# Patient Record
Sex: Female | Born: 1935 | Race: Black or African American | Hispanic: No | Marital: Married
Health system: Southern US, Community
[De-identification: ages and names within clinical notes are randomized; demographics above are authoritative.]

---

## 1998-07-29 ENCOUNTER — Other Ambulatory Visit: Admission: RE | Admit: 1998-07-29 | Discharge: 1998-07-29 | Payer: Self-pay | Admitting: *Deleted

## 2013-11-21 ENCOUNTER — Ambulatory Visit: Payer: Self-pay | Admitting: Family Medicine

## 2013-11-22 ENCOUNTER — Ambulatory Visit: Payer: Self-pay | Admitting: Family Medicine

## 2013-12-14 ENCOUNTER — Inpatient Hospital Stay: Payer: Self-pay | Admitting: Internal Medicine

## 2013-12-14 LAB — COMPREHENSIVE METABOLIC PANEL
Albumin: 2.7 g/dL — ABNORMAL LOW (ref 3.4–5.0)
Alkaline Phosphatase: 621 U/L — ABNORMAL HIGH
Anion Gap: 6 — ABNORMAL LOW (ref 7–16)
BILIRUBIN TOTAL: 1 mg/dL (ref 0.2–1.0)
BUN: 14 mg/dL (ref 7–18)
CALCIUM: 8.4 mg/dL — AB (ref 8.5–10.1)
CHLORIDE: 105 mmol/L (ref 98–107)
Co2: 27 mmol/L (ref 21–32)
Creatinine: 1.09 mg/dL (ref 0.60–1.30)
EGFR (African American): 53 — ABNORMAL LOW
EGFR (Non-African Amer.): 46 — ABNORMAL LOW
Glucose: 88 mg/dL (ref 65–99)
OSMOLALITY: 276 (ref 275–301)
Potassium: 3.6 mmol/L (ref 3.5–5.1)
SGOT(AST): 100 U/L — ABNORMAL HIGH (ref 15–37)
SGPT (ALT): 64 U/L — ABNORMAL HIGH
SODIUM: 138 mmol/L (ref 136–145)
TOTAL PROTEIN: 6.5 g/dL (ref 6.4–8.2)

## 2013-12-14 LAB — URINALYSIS, COMPLETE
BACTERIA: NEGATIVE
BILIRUBIN, UR: NEGATIVE
Glucose,UR: NEGATIVE mg/dL (ref 0–75)
Ketone: NEGATIVE
Leukocyte Esterase: NEGATIVE
NITRITE: NEGATIVE
Ph: 6 (ref 4.5–8.0)
Protein: NEGATIVE
RBC,UR: NONE SEEN /HPF (ref 0–5)
Specific Gravity: 1.005 (ref 1.003–1.030)
WBC UR: NONE SEEN /HPF (ref 0–5)

## 2013-12-14 LAB — CBC WITH DIFFERENTIAL/PLATELET
BASOS ABS: 0.1 10*3/uL (ref 0.0–0.1)
Basophil %: 0.7 %
EOS ABS: 0.1 10*3/uL (ref 0.0–0.7)
Eosinophil %: 1.2 %
HCT: 32.3 % — ABNORMAL LOW (ref 35.0–47.0)
HGB: 10.2 g/dL — ABNORMAL LOW (ref 12.0–16.0)
LYMPHS PCT: 16.5 %
Lymphocyte #: 1.3 10*3/uL (ref 1.0–3.6)
MCH: 31.6 pg (ref 26.0–34.0)
MCHC: 31.7 g/dL — AB (ref 32.0–36.0)
MCV: 100 fL (ref 80–100)
MONO ABS: 0.6 x10 3/mm (ref 0.2–0.9)
Monocyte %: 8.2 %
NEUTROS ABS: 5.6 10*3/uL (ref 1.4–6.5)
Neutrophil %: 73.4 %
PLATELETS: 137 10*3/uL — AB (ref 150–440)
RBC: 3.24 10*6/uL — ABNORMAL LOW (ref 3.80–5.20)
RDW: 14.8 % — AB (ref 11.5–14.5)
WBC: 7.6 10*3/uL (ref 3.6–11.0)

## 2013-12-14 LAB — TROPONIN I: Troponin-I: <0.02 ng/mL

## 2013-12-14 LAB — HEMOGLOBIN: HGB: 11.3 g/dL — ABNORMAL LOW (ref 12.0–16.0)

## 2013-12-15 LAB — HEMOGLOBIN: HGB: 10 g/dL — AB (ref 12.0–16.0)

## 2013-12-15 LAB — CBC WITH DIFFERENTIAL/PLATELET
Basophil #: 0.1 10*3/uL (ref 0.0–0.1)
Basophil %: 1 %
Eosinophil #: 0.2 10*3/uL (ref 0.0–0.7)
Eosinophil %: 2.5 %
HCT: 30.9 % — ABNORMAL LOW (ref 35.0–47.0)
HGB: 9.9 g/dL — ABNORMAL LOW (ref 12.0–16.0)
Lymphocyte #: 1.2 10*3/uL (ref 1.0–3.6)
Lymphocyte %: 16.1 %
MCH: 31.8 pg (ref 26.0–34.0)
MCHC: 32.1 g/dL (ref 32.0–36.0)
MCV: 99 fL (ref 80–100)
Monocyte #: 0.7 x10 3/mm (ref 0.2–0.9)
Monocyte %: 8.9 %
NEUTROS ABS: 5.2 10*3/uL (ref 1.4–6.5)
Neutrophil %: 71.5 %
PLATELETS: 121 10*3/uL — AB (ref 150–440)
RBC: 3.12 10*6/uL — AB (ref 3.80–5.20)
RDW: 14.5 % (ref 11.5–14.5)
WBC: 7.3 10*3/uL (ref 3.6–11.0)

## 2013-12-15 LAB — BASIC METABOLIC PANEL
Anion Gap: 10 (ref 7–16)
BUN: 12 mg/dL (ref 7–18)
CHLORIDE: 108 mmol/L — AB (ref 98–107)
Calcium, Total: 8.1 mg/dL — ABNORMAL LOW (ref 8.5–10.1)
Co2: 24 mmol/L (ref 21–32)
Creatinine: 0.83 mg/dL (ref 0.60–1.30)
EGFR (African American): >60
EGFR (Non-African Amer.): >60
GLUCOSE: 85 mg/dL (ref 65–99)
OSMOLALITY: 282 (ref 275–301)
Potassium: 3.7 mmol/L (ref 3.5–5.1)
Sodium: 142 mmol/L (ref 136–145)

## 2013-12-19 LAB — CANCER ANTIGEN 19-9

## 2013-12-19 LAB — CEA: CEA: 119.8 ng/mL — AB (ref 0.0–4.7)

## 2013-12-19 LAB — CA 125: CA 125: 479.5 U/mL — AB (ref 0.0–34.0)

## 2013-12-21 ENCOUNTER — Ambulatory Visit: Payer: Self-pay | Admitting: Gastroenterology

## 2013-12-25 LAB — PATHOLOGY REPORT

## 2013-12-28 ENCOUNTER — Ambulatory Visit: Payer: Self-pay | Admitting: Internal Medicine

## 2013-12-28 LAB — CBC CANCER CENTER
BASOS PCT: 0.4 %
Basophil #: 0 x10 3/mm (ref 0.0–0.1)
Eosinophil #: 0 x10 3/mm (ref 0.0–0.7)
Eosinophil %: 0.4 %
HCT: 25.4 % — AB (ref 35.0–47.0)
HGB: 8.2 g/dL — AB (ref 12.0–16.0)
LYMPHS ABS: 1.5 x10 3/mm (ref 1.0–3.6)
Lymphocyte %: 15.3 %
MCH: 32.1 pg (ref 26.0–34.0)
MCHC: 32.4 g/dL (ref 32.0–36.0)
MCV: 99 fL (ref 80–100)
MONOS PCT: 5.9 %
Monocyte #: 0.6 x10 3/mm (ref 0.2–0.9)
NEUTROS ABS: 7.7 x10 3/mm — AB (ref 1.4–6.5)
Neutrophil %: 78 %
PLATELETS: 145 x10 3/mm — AB (ref 150–440)
RBC: 2.57 10*6/uL — ABNORMAL LOW (ref 3.80–5.20)
RDW: 14.4 % (ref 11.5–14.5)
WBC: 9.8 x10 3/mm (ref 3.6–11.0)

## 2013-12-28 LAB — PROTIME-INR
INR: 1.3
Prothrombin Time: 15.7 secs — ABNORMAL HIGH (ref 11.5–14.7)

## 2013-12-28 LAB — HEPATIC FUNCTION PANEL A (ARMC)
ALK PHOS: 606 U/L — AB
Albumin: 3 g/dL — ABNORMAL LOW (ref 3.4–5.0)
Bilirubin, Direct: 0.7 mg/dL — ABNORMAL HIGH (ref 0.00–0.20)
Bilirubin,Total: 1.1 mg/dL — ABNORMAL HIGH (ref 0.2–1.0)
SGOT(AST): 78 U/L — ABNORMAL HIGH (ref 15–37)
SGPT (ALT): 60 U/L
Total Protein: 6.2 g/dL — ABNORMAL LOW (ref 6.4–8.2)

## 2013-12-28 LAB — APTT: Activated PTT: 29.3 secs (ref 23.6–35.9)

## 2013-12-28 LAB — CREATININE, SERUM
CREATININE: 1.18 mg/dL (ref 0.60–1.30)
EGFR (African American): 56 — ABNORMAL LOW
EGFR (Non-African Amer.): 46 — ABNORMAL LOW

## 2013-12-29 ENCOUNTER — Emergency Department: Payer: Self-pay | Admitting: Emergency Medicine

## 2013-12-29 LAB — CBC WITH DIFFERENTIAL/PLATELET
Basophil #: 0.1 10*3/uL (ref 0.0–0.1)
Basophil %: 0.7 %
EOS PCT: 0.7 %
Eosinophil #: 0.1 10*3/uL (ref 0.0–0.7)
HCT: 24 % — ABNORMAL LOW (ref 35.0–47.0)
HGB: 7.6 g/dL — AB (ref 12.0–16.0)
LYMPHS ABS: 0.9 10*3/uL — AB (ref 1.0–3.6)
Lymphocyte %: 10.9 %
MCH: 31.3 pg (ref 26.0–34.0)
MCHC: 31.7 g/dL — ABNORMAL LOW (ref 32.0–36.0)
MCV: 99 fL (ref 80–100)
MONOS PCT: 6.4 %
Monocyte #: 0.5 x10 3/mm (ref 0.2–0.9)
Neutrophil #: 7 10*3/uL — ABNORMAL HIGH (ref 1.4–6.5)
Neutrophil %: 81.3 %
Platelet: 146 10*3/uL — ABNORMAL LOW (ref 150–440)
RBC: 2.43 10*6/uL — ABNORMAL LOW (ref 3.80–5.20)
RDW: 14.3 % (ref 11.5–14.5)
WBC: 8.6 10*3/uL (ref 3.6–11.0)

## 2013-12-29 LAB — URINALYSIS, COMPLETE
Bacteria: NONE SEEN
RBC,UR: 10074 /HPF (ref 0–5)
Specific Gravity: 1.018 (ref 1.003–1.030)
WBC UR: 6 /HPF (ref 0–5)

## 2013-12-29 LAB — COMPREHENSIVE METABOLIC PANEL
ALT: 54 U/L
ANION GAP: 8 (ref 7–16)
Albumin: 2.6 g/dL — ABNORMAL LOW (ref 3.4–5.0)
Alkaline Phosphatase: 569 U/L — ABNORMAL HIGH
BILIRUBIN TOTAL: 1 mg/dL (ref 0.2–1.0)
BUN: 19 mg/dL — ABNORMAL HIGH (ref 7–18)
CHLORIDE: 102 mmol/L (ref 98–107)
CO2: 30 mmol/L (ref 21–32)
Calcium, Total: 8.7 mg/dL (ref 8.5–10.1)
Creatinine: 0.99 mg/dL (ref 0.60–1.30)
GFR CALC NON AF AMER: 57 — AB
GLUCOSE: 81 mg/dL (ref 65–99)
Osmolality: 281 (ref 275–301)
Potassium: 3.3 mmol/L — ABNORMAL LOW (ref 3.5–5.1)
SGOT(AST): 77 U/L — ABNORMAL HIGH (ref 15–37)
Sodium: 140 mmol/L (ref 136–145)
Total Protein: 6.1 g/dL — ABNORMAL LOW (ref 6.4–8.2)

## 2013-12-29 LAB — TROPONIN I: TROPONIN-I: 0.03 ng/mL

## 2013-12-31 ENCOUNTER — Emergency Department: Payer: Self-pay | Admitting: Emergency Medicine

## 2013-12-31 LAB — URINE CULTURE

## 2014-01-01 ENCOUNTER — Ambulatory Visit: Payer: Self-pay | Admitting: Internal Medicine

## 2014-01-03 ENCOUNTER — Ambulatory Visit: Payer: Self-pay | Admitting: Internal Medicine

## 2014-01-04 LAB — PATHOLOGY REPORT

## 2014-01-07 LAB — URINALYSIS, COMPLETE
Bacteria: NONE SEEN
Specific Gravity: 1.018 (ref 1.003–1.030)
Squamous Epithelial: NONE SEEN

## 2014-01-09 LAB — URINE CULTURE

## 2014-02-03 ENCOUNTER — Ambulatory Visit: Payer: Self-pay | Admitting: Internal Medicine

## 2014-02-03 DEATH — deceased

## 2014-07-27 NOTE — Consult Note (Signed)
Pt seen and examined. Please see K Earle's notes. Melena with heme positive stool and anemia. EGD was normal. In light of liver mets, patient does need to have colonoscopy scheduled. Will order tumor markers. Endoscopy closed tomorrow due to " fixing air pressures". I am not here on Sunday.  If patient requires further w/u while in hospital, then I will order bowel prep on Sunday for colonoscopy Monday AM. Otherwise, patient can be discharged and colonoscopy can be arranged as outpatient soon. Thanks.  Electronic Signatures: Lutricia Feilh, Elbert Spickler (MD)  (Signed on 11-Sep-15 15:10)  Authored  Last Updated: 11-Sep-15 15:10 by Lutricia Feilh, Donalda Job (MD)

## 2014-07-27 NOTE — H&P (Signed)
PATIENT NAME:  Katelyn Small, BALEY MR#:  [id number] DATE OF BIRTH:  03-10-1926  DATE OF ADMISSION:  12/14/2013  ADMITTING PHYSICIAN: Gladstone Lighter, MD  PRIMARY CARE PHYSICIAN: Nonlocal. It is actually Dr. Shawn Stall at [facility].   CHIEF COMPLAINT: Dark stools.   HISTORY OF PRESENT ILLNESS: Katelyn Small is an 81 year old African American female with past medical history significant for only hypertension, dementia and peripheral neuropathy, has been following up more frequently in the last few weeks secondary to newly diagnosed liver masses. According to daughter, who gives most of the history, the patient had routine blood work done at the medical center and recently was noted to have elevated liver tests. She had a follow-up ultrasound done in August, which showed multiple liver lesions following which she had a CT of the abdomen done which confirmed the lesions and they suspect widespread metastatic disease. She has a followup appointment with a GI physician for considering doing a liver biopsy because they say it is probably of unknown primary. She has not had further whole-body CT scan or a PET scan done. However, she went to the medical center yesterday and noted that her hemoglobin dropped from 12.5 in August 2015 to 10 yesterday, which was December 13, 2013. All her lab work and CT and ultrasound reports are brought by daughter here. Her hemoglobin in June was 13.4. Her hemoglobin in August was 12.5 and yesterday was 10. She had a guaiac test done for the stool yesterday, which came back positive and she was advised to come to the hospital for further work-up. The patient is not hypotensive or tachycardic at this time. She says her main issue is constipation over the last 3 to 4 days mostly and even before that for a week maybe. She never had this issue before, but her stools have been dark too for the last couple of days, so she is being admitted for GI bleed and drop in hemoglobin.    PAST MEDICAL HISTORY:  1.  Hypertension.  2.  Peripheral neuropathy.  3.  Dementia.   PAST SURGICAL HISTORY: None.   ALLERGIES: No known drug allergies.  CURRENT HOME MEDICATIONS: 1.  Aspirin 91 mg p.o. daily.  2.  Atenolol 25 mg p.o. b.i.d.  3.  Gabapentin 100 mg p.o. b.i.d.  4.  Imdur 30 mg p.o. daily.  5.  Norvasc/benazepril 10/40 mg 1 tablet p.o. daily.  6.  Potassium chloride 20 mEq p.o. daily.  7.  Donepezil 10 mg p.o. daily.   SOCIAL HISTORY: Lives at home with her husband, uses a cane to ambulate at times. Otherwise, she is steady on her feet, active at baseline. No smoking or alcohol use.   FAMILY HISTORY: Sister with ovarian cancer. Two of her daughters passed away with breast cancer.   REVIEW OF SYSTEMS: CONSTITUTIONAL: No fever, fatigue, or weakness.  EYES: No blurred vision, double vision, inflammation or glaucoma. Had lens surgery done on her right eye.  ENT: No tinnitus, ear pain, hearing loss, epistaxis or discharge.  RESPIRATORY: No cough, wheeze, hemoptysis or COPD. CARDIOVASCULAR: No chest pain, orthopnea, edema, arrhythmia, palpitations, or syncope.  GASTROINTESTINAL: No nausea or vomiting. No diarrhea. No abdominal pain. Positive for melena and constipation.  GENITOURINARY: No dysuria, hematuria, renal calculus, frequency, or incontinence.  ENDOCRINE: No polyuria, nocturia, thyroid problems, heat or cold intolerance.  HEMATOLOGY: Positive for anemia. No easy bruising or bleeding.  SKIN: No acne, rash or lesions. MUSCULOSKELETAL: Positive for bilateral foot pain from neuropathy, but no  arthritis or gout.  NEUROLOGIC: No numbness, weakness, CVA, TIA or seizures.  PSYCHOLOGICAL: No anxiety, insomnia or depression.   PHYSICAL EXAMINATION: VITAL SIGNS: Temperature 98.8 degrees Fahrenheit, pulse 58, respirations 18, blood pressure 156/75, pulse ox 96% on room air.  GENERAL: Well-built, well-nourished female lying in bed, not in any acute distress.  HEENT:  Normocephalic, atraumatic. Pupils equal, round and reacting to light. In the right eye, there seems like an extraocular lens placed. Anicteric sclerae. Extraocular movements intact. Oropharynx clear without erythema, mass or exudates.  NECK: Supple. No thyromegaly, JVD or carotid bruits. No masses. Normal range of motion without pain.  LUNGS: Moving air bilaterally. Decreased bibasilar breath sounds. No wheezing. No crackles. No use of accessory muscles for breathing.  HEART: S1 and S2, regular rate and rhythm. No murmurs, rubs, or gallops.  ABDOMEN: Soft, nontender, nondistended. No hepatosplenomegaly. Normal bowel sounds. BREASTS: No palpable breast masses.  EXTREMITIES: No pedal edema. No clubbing or cyanosis. 2+ dorsalis pedis pulses palpable bilaterally.  SKIN: No acne, rash or lesions.  LYMPH: No cervical or axillary lymphadenopathy.  NEUROLOGIC: Cranial nerves intact. No focal motor or sensory deficits.  PSYCHOLOGICAL: The patient is awake, alert and oriented x3. Gets confused to situation at times.   DIAGNOSTIC DATA: WBC 7.6, hemoglobin 10.2, hematocrit 32.3, platelet count 137,000.   Sodium 138, potassium 3.6, chloride 105, bicarb 27, BUN 14, creatinine 1.09, glucose 88, calcium 8.4.  ALT 64, AST 100, alk phos 621, total bili 1, albumin 2.7. Troponin less than 0.02.  EKG is showing sinus bradycardia, heart rate of 45 with some PACs, but no acute ST-T wave abnormalities.   ASSESSMENT AND PLAN: An 81 year old female with history of hypertension, dementia, neuropathy, and recently diagnosed liver masses in August 2015, brought for constipation and melena and anemia.  1.  Gastrointestinal bleed with melena and guaiac-positive stools. Gastroenterology has been consulted. IV Protonix has been started. Hold aspirin. Likely EGD today.  2.  Acute on chronic anemia. Hemoglobin in June was 13, in August it was 12, and now at 10. Likely source is gastrointestinal. EGD will be done and she will  also need a colonoscopy because of her constipation. Check hemoglobin q. 8 hours.  3.  Elevated liver function tests. As outpatient had CT done in August which showed multiple liver lesions, the largest of which is in the right lobe. Could be widespread metastatic disease. She has follow-up as outpatient for biopsy of the liver lesion, unknown primary, but she will get her colonoscopy as inpatient with her constipation issues.  4.  Hypertension. Continue her home medications. Continue to monitor her blood pressure closely. She is on Norvasc, benazepril, Imdur and atenolol. It seems like she is bradying down a little, so will hold the atenolol. 5.  Dementia. Seems to be at baseline. Continue her Aricept.  6.  Deep vein thrombosis prophylaxis. On TEDs and SCDs.   CODE STATUS: FULL.  TIME SPENT ON ADMISSION: 50 minutes.  ____________________________ Gladstone Lighter, MD rk:sb D: 12/14/2013 13:51:58 ET T: 12/14/2013 14:18:26 ET JOB#: [id number]  cc: Gladstone Lighter, MD, <Dictator> [facility] - Dr. Primus Bravo MD ELECTRONICALLY SIGNED 12/16/2013 11:13

## 2014-07-27 NOTE — Consult Note (Signed)
PATIENT NAME:  Katelyn Small, TORPEY MR#:  [id number] DATE OF BIRTH:  1925/05/05  DATE OF CONSULTATION:  12/14/2013  REFERRING PHYSICIAN:  Enid Baas, MD CONSULTING PHYSICIAN:  Hardie Shackleton. Grabiela Wohlford, PA-C  GASTROENTEROLOGY CONSULTATION   CONSULTING ATTENDING: Ezzard Standing. Bluford Kaufmann, MD  REASON FOR CONSULTATION: Melena.   HISTORY OF PRESENT ILLNESS: This is a pleasant 81 year old female accompanied by a family member still in the Emergency Department, who is going to be admitted for new onset melena. Per the family member, she started noticing black-colored stool since this past Tuesday, about 4 days ago. Prior to this, the patient's daughter also reports that she has been more constipated recently. The patient is denying any abdominal pain. The only pain that she is reporting currently is some lower extremity pain, for which he was actually just started on gabapentin as an outpatient. There was no nausea or vomiting. The stool is described as black, tarry and sticky, although she has not had a bowel movement for the past 2 days, and she explains that she "can't go". Recently as an outpatient she was found to have some elevated LFTs and underwent an abdominal ultrasound that was suspicious for some liver lesions. A follow-up CT scan was obtained on 11/22/2013 showing liver lesions concerning for metastatic disease, the largest measuring about 3.6 cm in the right anterior lobe of the liver without any clear primary identified. There have been no unintentional weight changes. No fever or chills. No chest pain or shortness of breath. The patient does have some early dementia and cannot tell me if she has ever had an EGD or colonoscopy in the past. She has not eaten today. She denies any NSAIDs at home outside of a baby aspirin. No alcohol use or tobacco use.   PAST MEDICAL HISTORY: Hypertension, early dementia, newly diagnosed liver lesions concerning for metastatic disease with an unidentified primary source at this point.  Neuropathy.   ALLERGIES: No known drug allergies.   HOME MEDICATIONS: Amlodipine, benazepril, baby aspirin, atenolol, gabapentin, Aricept, Imdur, NovoLog and potassium chloride.   FAMILY HISTORY: There is no known family history of GI malignancy, colon polyps, or IBD.   SOCIAL HISTORY: The patient denies any alcohol, tobacco or illicit drug use.   REVIEW OF SYSTEMS: A 10-system review of systems was obtained on the patient with the assistance of her family member. All pertinent positives are mentioned above and are otherwise negative.   OBJECTIVE:  VITAL SIGNS: Blood pressure 159/78, heart rate 55, bedside pulse oximetry 99% on room air.  GENERAL: This is a pleasant 81 year old female resting quietly and comfortably in bed, in no acute distress. Alert and oriented x 3.  HEAD: Atraumatic, normocephalic.  NECK: Supple. No lymphadenopathy noted.  HEENT: Sclerae anicteric. Mucous membranes are moist.  LUNGS: Respirations are even and unlabored. Clear to auscultation bilateral anterior lung fields.  CARDIAC: Regular rate and rhythm. S1, S2 noted.  ABDOMEN: Soft, nontender, nondistended. Normoactive bowel sounds noted in all 4 quadrants. No guarding or rebound. No masses, hernias or organomegaly appreciated.  PSYCHIATRIC: Appropriate mood and affect. RECTAL: Deferred.  EXTREMITIES: Negative for lower extremity edema; 2+ pulses noted in bilateral upper extremities.   LABORATORY DATA: White blood cells 7.6, hemoglobin 10.2, hematocrit 32.3, MCV is 100, platelets 137. Sodium 138, potassium 3.6, BUN 14, creatinine 1.09, glucose 88, bilirubin 1.0, alkaline phosphatase 621, ALT 64, AST 100, albumin 2.7.   IMAGING: An ultrasound of the abdomen was obtained on 11/21/2013, showing rounded liver lesions measuring in largest dimension 2.6  cm, another 1.2 cm, and a third 3.3 cm, concerning for metastatic disease.   A follow-up CT scan was obtained on 11/22/2013 of the abdomen and pelvis with contrast.  Findings were consistent with widespread metastatic disease in the liver. There was also metastatic disease present in the omentum bilaterally. No primary malignancy was identified. There was no evidence of bowel thickening or mass lesion.   ASSESSMENT:  1.  Melena.  2.  Elevated liver function tests.  3.  Multiple liver lesions, concerning for metastatic disease, with no primary malignancy identified at this point.    PLAN: I have discussed this patient's case in detail with Dr. Lutricia FeilPaul Oh, who was involved in the development of the patient's plan of care. Certainly, because of her reported history of melena, we agree with checking hemoglobin serially, as well as continuing PPI drip for now. We would like to keep her n.p.o. status and proceed with an upper EGD this afternoon with Dr. Bluford Kaufmannh. The risks and benefits of this procedure were outlined in detail for the patient, including risk of bleeding, perforation, infection and anesthesia, and she verbalized understanding and is in agreement to proceed. Because of the liver lesions seen on imaging, without any clear primary, we would like to check a CEA level as well. We will continue to monitor this patient throughout her hospitalization and make further recommendations pending the above endoscopic intervention and per clinical course.   All questions were answered.   Thank you so much for this consultation.   These services provided by Hardie ShackletonKaryn M. Izell Labat, PA-C under collaborative agreement with Ezzard StandingPaul Y. Bluford Kaufmannh, MD.     ____________________________ Hardie ShackletonKaryn M. Assad Harbeson, PA-C kme:MT D: 12/14/2013 13:57:00 ET T: 12/14/2013 14:37:17 ET JOB#: [id number]  cc: Hardie ShackletonKaryn M. Jeslin Bazinet, PA-C, <Dictator> Hardie ShackletonKARYN M Macon Lesesne PA ELECTRONICALLY SIGNED 12/14/2013 14:56

## 2014-07-27 NOTE — Discharge Summary (Signed)
PATIENT NAME:  Katelyn Small, Katelyn Small MR#:  [id number] DATE OF BIRTH:  January 16, 1926  DATE OF ADMISSION:  12/14/2013 DATE OF DISCHARGE:  12/15/2013  ADMITTING PHYSICIAN: Enid Baasadhika Jameil Whitmoyer, MD  DISCHARGING PHYSICIAN: Enid Baasadhika Tevyn Codd, MD  [facility]   CONSULTATIONS IN THE HOSPITAL: GI consultation by Dr. Lutricia FeilPaul Oh.   DISCHARGE DIAGNOSES:  1. Melena, with normal esophagogastroduodenoscopy. Colonoscopy scheduled as outpatient.  2. Anemia, acute on chronic, may be gastrointestinal source.  3. Liver masses, metastatic disease of unknown primary.  4. Transaminitis.  5. Hypertension.  6. Sinus bradycardia.  7. Dementia.  8. Constipation.  DISCHARGE HOME MEDICATIONS:  1. Lotrel 10/40 mg 1 tablet p.o. daily.  2. Lansoprazole 30 mg p.o. daily.  3. Gabapentin 100 mg p.o. b.i.d.  4. Aricept 10 mg p.o. at bedtime.  5. Colace 100 mg p.o. b.i.d.  6. Milk of Magnesia 30 mL daily p.r.n. for constipation.   DISCHARGE DIET: Low-sodium diet.   DISCHARGE ACTIVITY: As tolerated.    FOLLOWUP INSTRUCTIONS:  1. Advised to call Dr. Nigel Bridgemanh's office on Monday, which is 12/17/2013, after noon to schedule for a colonoscopy.  2. PCP followup in 1 week.  3. Outpatient followup with GI as scheduled for liver mass biopsy.   LABORATORIES AND IMAGING STUDIES PRIOR TO DISCHARGE:  WBC 7.3, hemoglobin 9.9, hematocrit 30.9, platelet count 121.  Sodium 142, potassium 3.7, chloride 108, bicarbonate 24, BUN 12, creatinine 0.83, glucose 85, and calcium of 8.1.  Urinalysis negative for any infection.  ALT 64, AST 100, alkaline phosphatase 621, total bilirubin 1.0, albumin of 2.7.  Troponins negative.   BRIEF HOSPITAL COURSE: Katelyn Small is an 81 year old African-American female with past medical history significant for hypertension and dementia, who was recently diagnosed to have elevated LFTs on routine blood work, which led to getting an ultrasound and CT abdomen, which confirmed multiple  masses in the liver indicative of metastatic disease of unknown primary. She saw GI as an outpatient and was in the process of getting a liver biopsy done; however, presented to the hospital with melena and bloody stools.   1. Melena, likely a gastrointestinal bleed, initially thought to be upper GI. Had GI see her in the hospital. Upper GI endoscopy was negative, with normal esophagus, duodenum and stomach. She will need a colonoscopy especially with new complaint of constipation and with these liver masses. However, the patient opted to get that done as an outpatient next week; because of the weekend, she does not want to wait in the hospital. Her hemoglobin has remained stable at 10.  2. Acute on chronic anemia. Hemoglobin in June was 13, in August was 12, dropped down to 10 this month as an outpatient and has remained steady in the hospital in spite of her dark stools. So will definitely need that colonoscopy to rule out GI source of bleeding and also to rule out malignancy. Tumor markers have been ordered.  3. Transaminitis with multiple liver masses. Will need biopsy to follow up tumor markers and will need colonoscopy as an outpatient.  4. Hypertension. Her home medications, Imdur, benazepril and Norvasc, are being continued with well control of her blood pressure. Atenolol is being stopped due to sinus bradycardia.  5. Dementia. She appears at baseline. Continue her Aricept.  6. Her course has been otherwise uneventful in the hospital.   DISCHARGE CONDITION: Stable.   DISCHARGE DISPOSITION: Home.   TIME SPENT ON DISCHARGE: 40 minutes.   ____________________________ Enid Baasadhika Fortino Haag, MD rk:lb D: 12/15/2013 11:07:30  ET T: 12/15/2013 11:18:11 ET JOB#: [id number]  cc: Enid Baas, MD, <Dictator> Ezzard Standing. Bluford Kaufmann, MD [facility] Enid Baas MD ELECTRONICALLY SIGNED 01/03/2014 9:48

## 2015-01-14 IMAGING — CR DG ABDOMEN 2V
1 series · 3 of 3 positions shown · non-contrast
Comparison: CT abdomen 11/22/2013

CLINICAL DATA: Vomiting, decreased bowel movement

EXAM:
ABDOMEN - 2 VIEW

[Series 1: dxr abdomen 2 v flat and erect · 0.14mm/px · 3 of 3 slices shown]
[im 1/3]
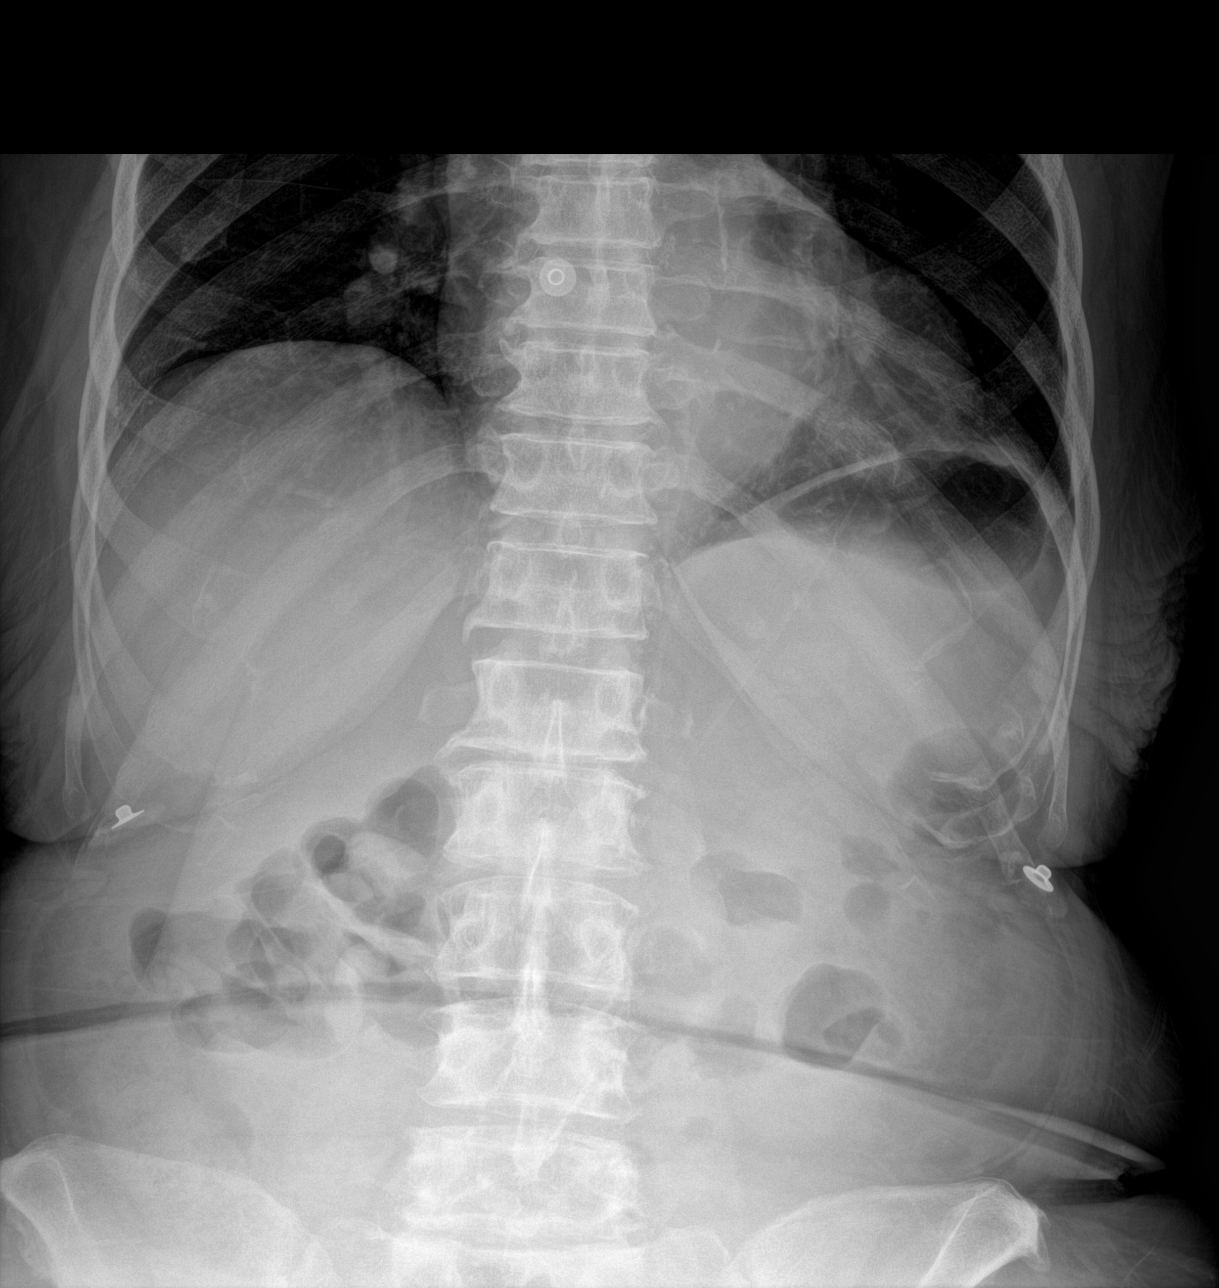
[im 2/3]
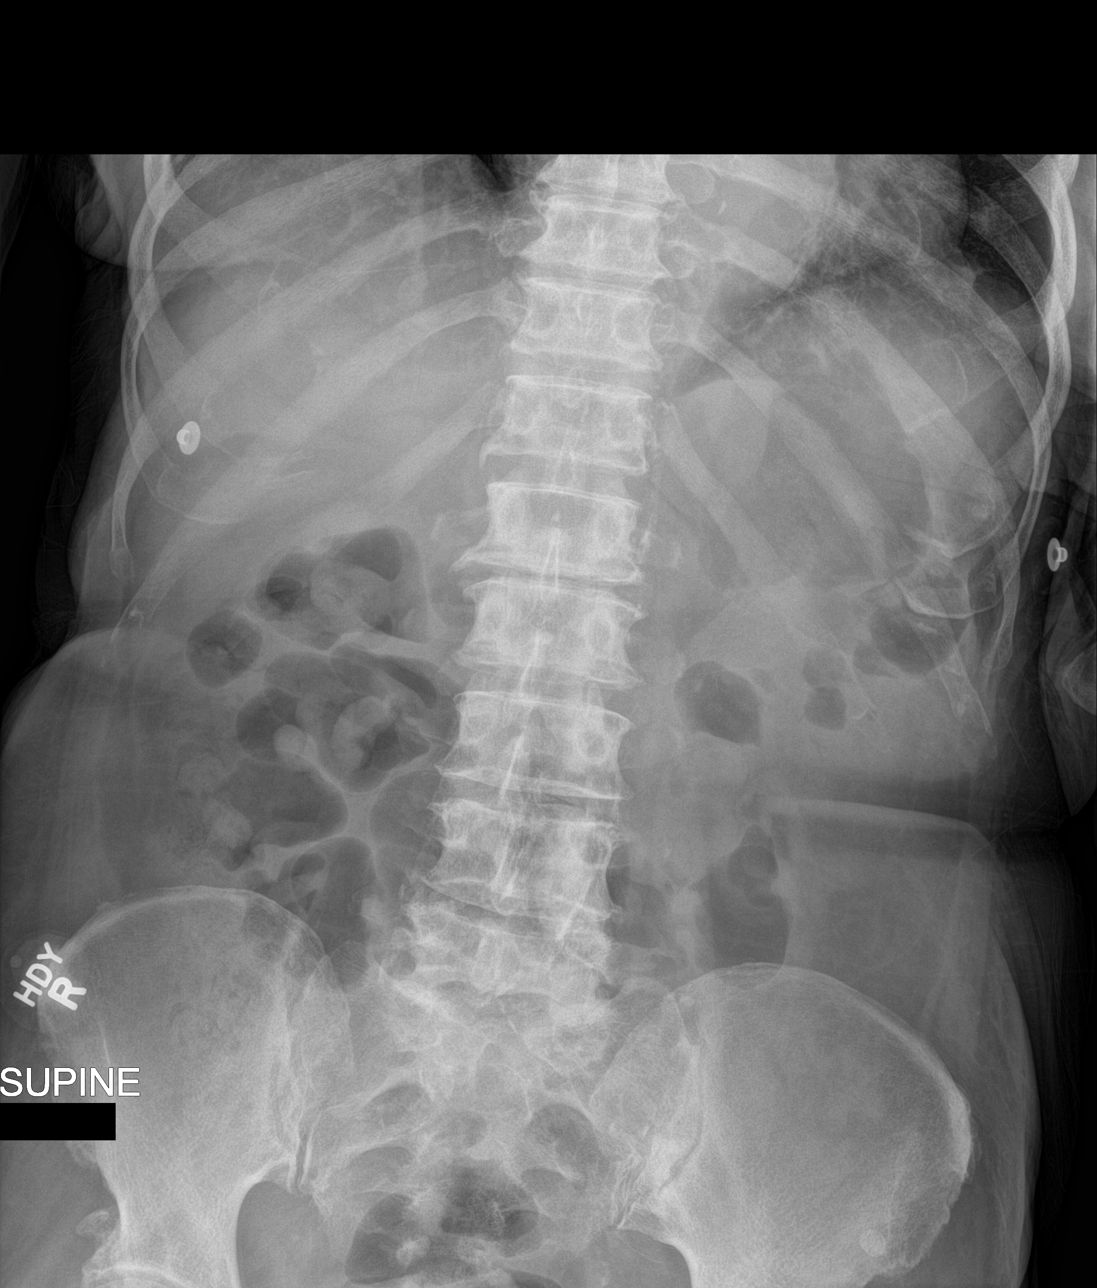
[im 3/3]
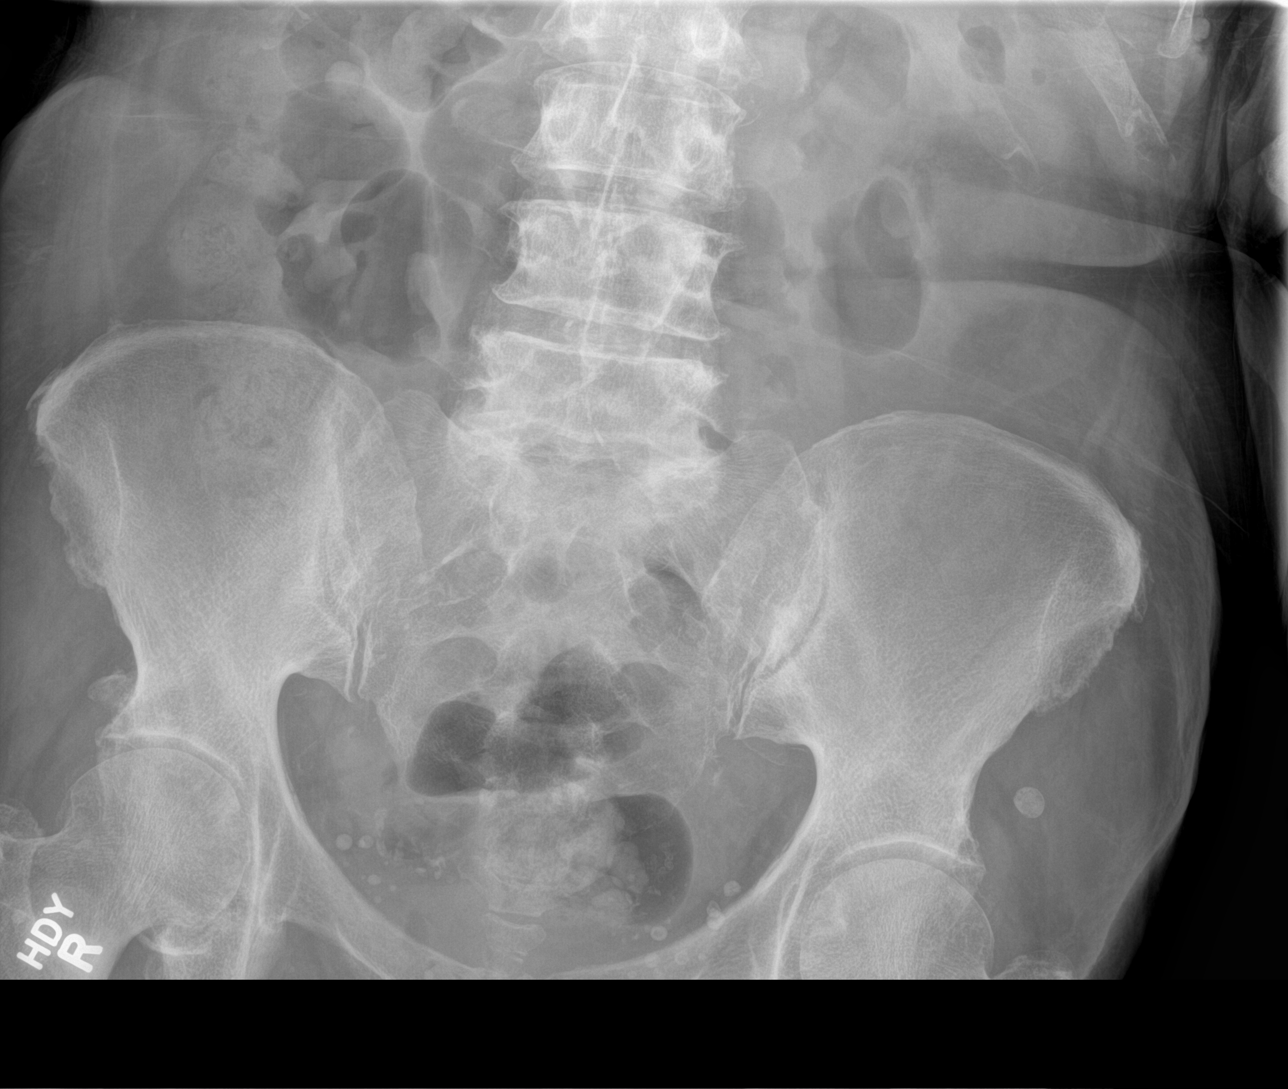

[3 of 3 positions shown; findings below may reference images not displayed]

FINDINGS: The bowel gas pattern is normal. There is no evidence of free air.
No radio-opaque calculi or other significant radiographic
abnormality is seen.
IMPRESSION: Negative.

## 2015-01-14 IMAGING — CR DG CHEST 2V
1 series · 2 of 2 positions shown · non-contrast
Comparison: None.

CLINICAL DATA: Fatigue, loss of appetite, dementia

EXAM:
CHEST  2 VIEW

[Series 1: dxr chest pa (or ap) and lateral · 0.14mm/px · 2 of 2 slices shown]
[im 1/2]
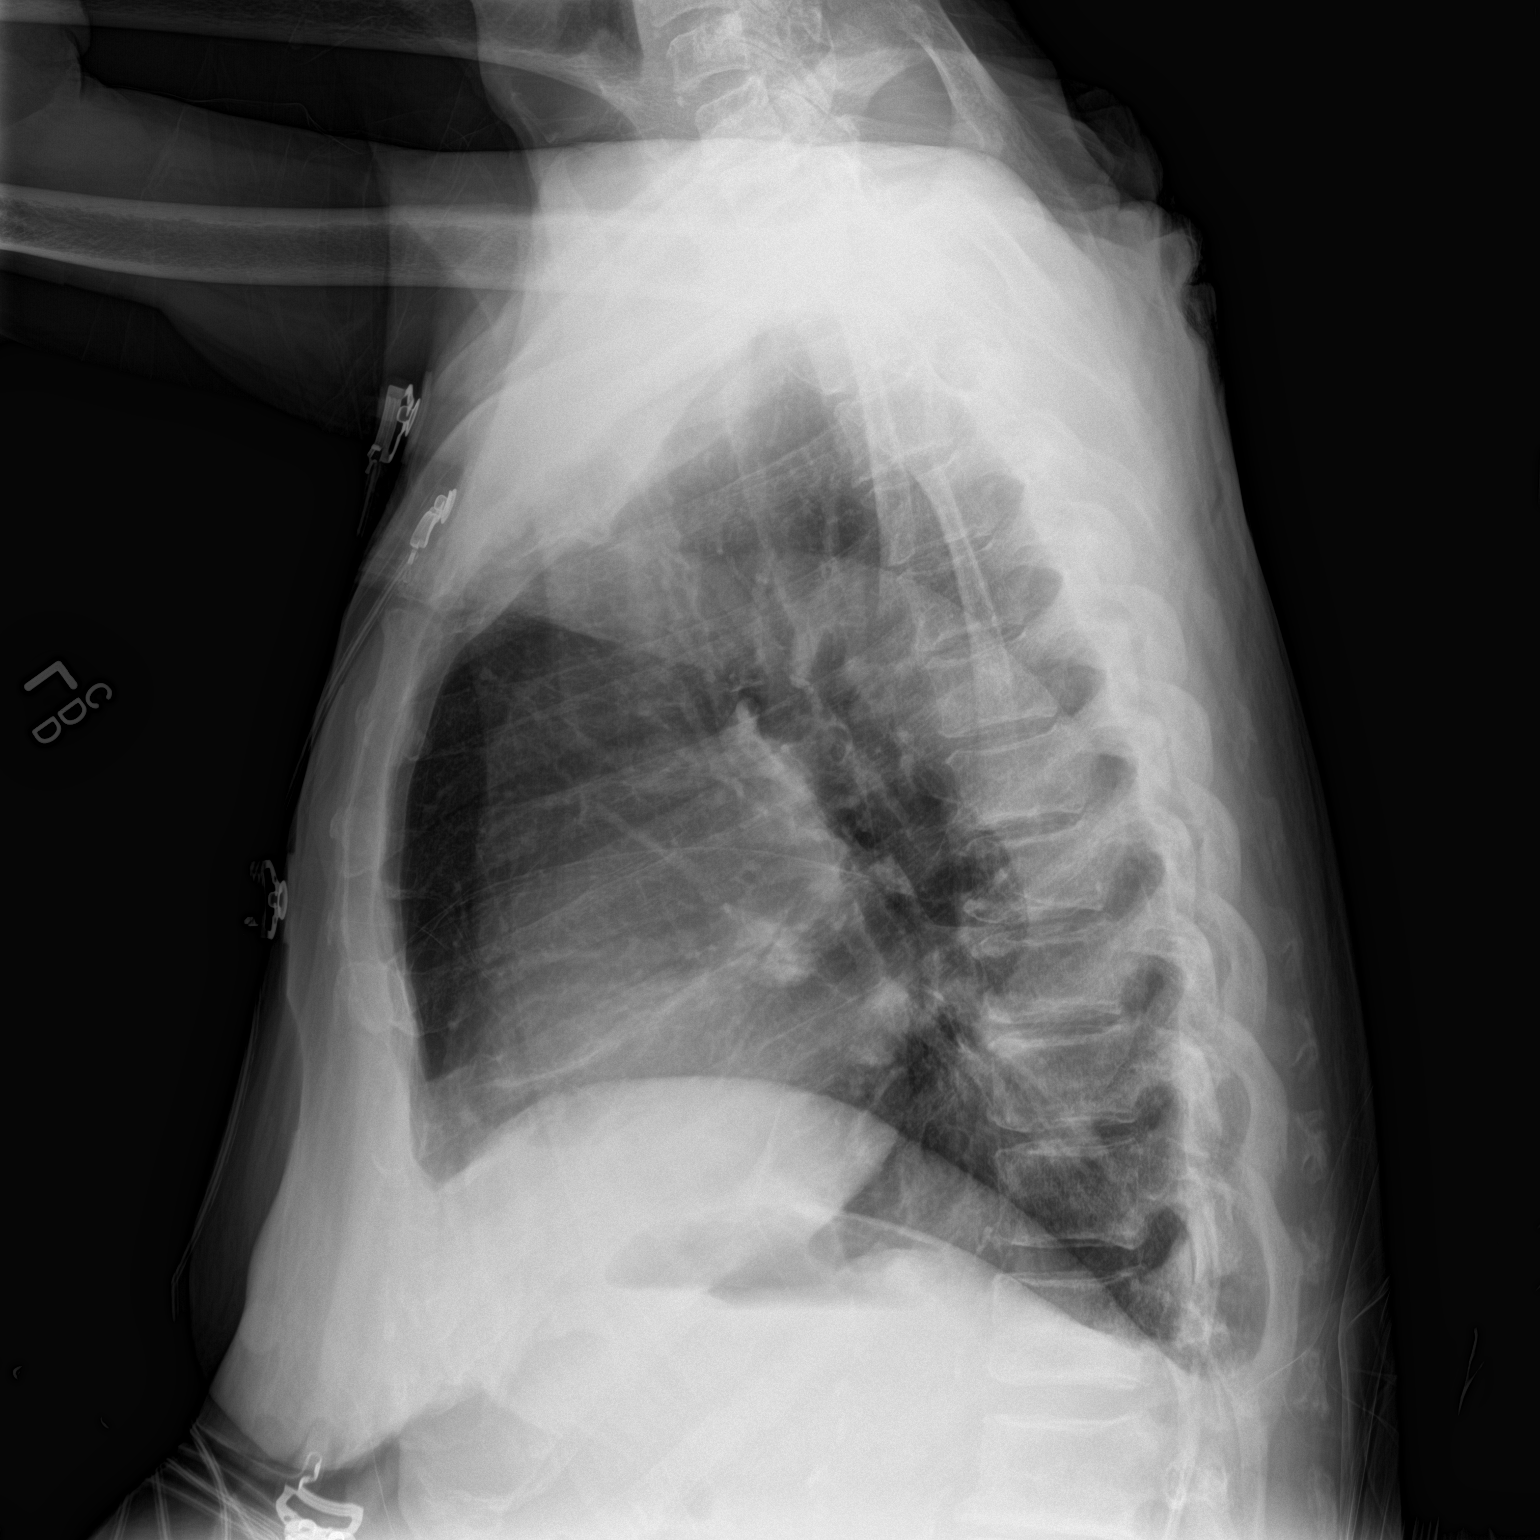
[im 2/2]
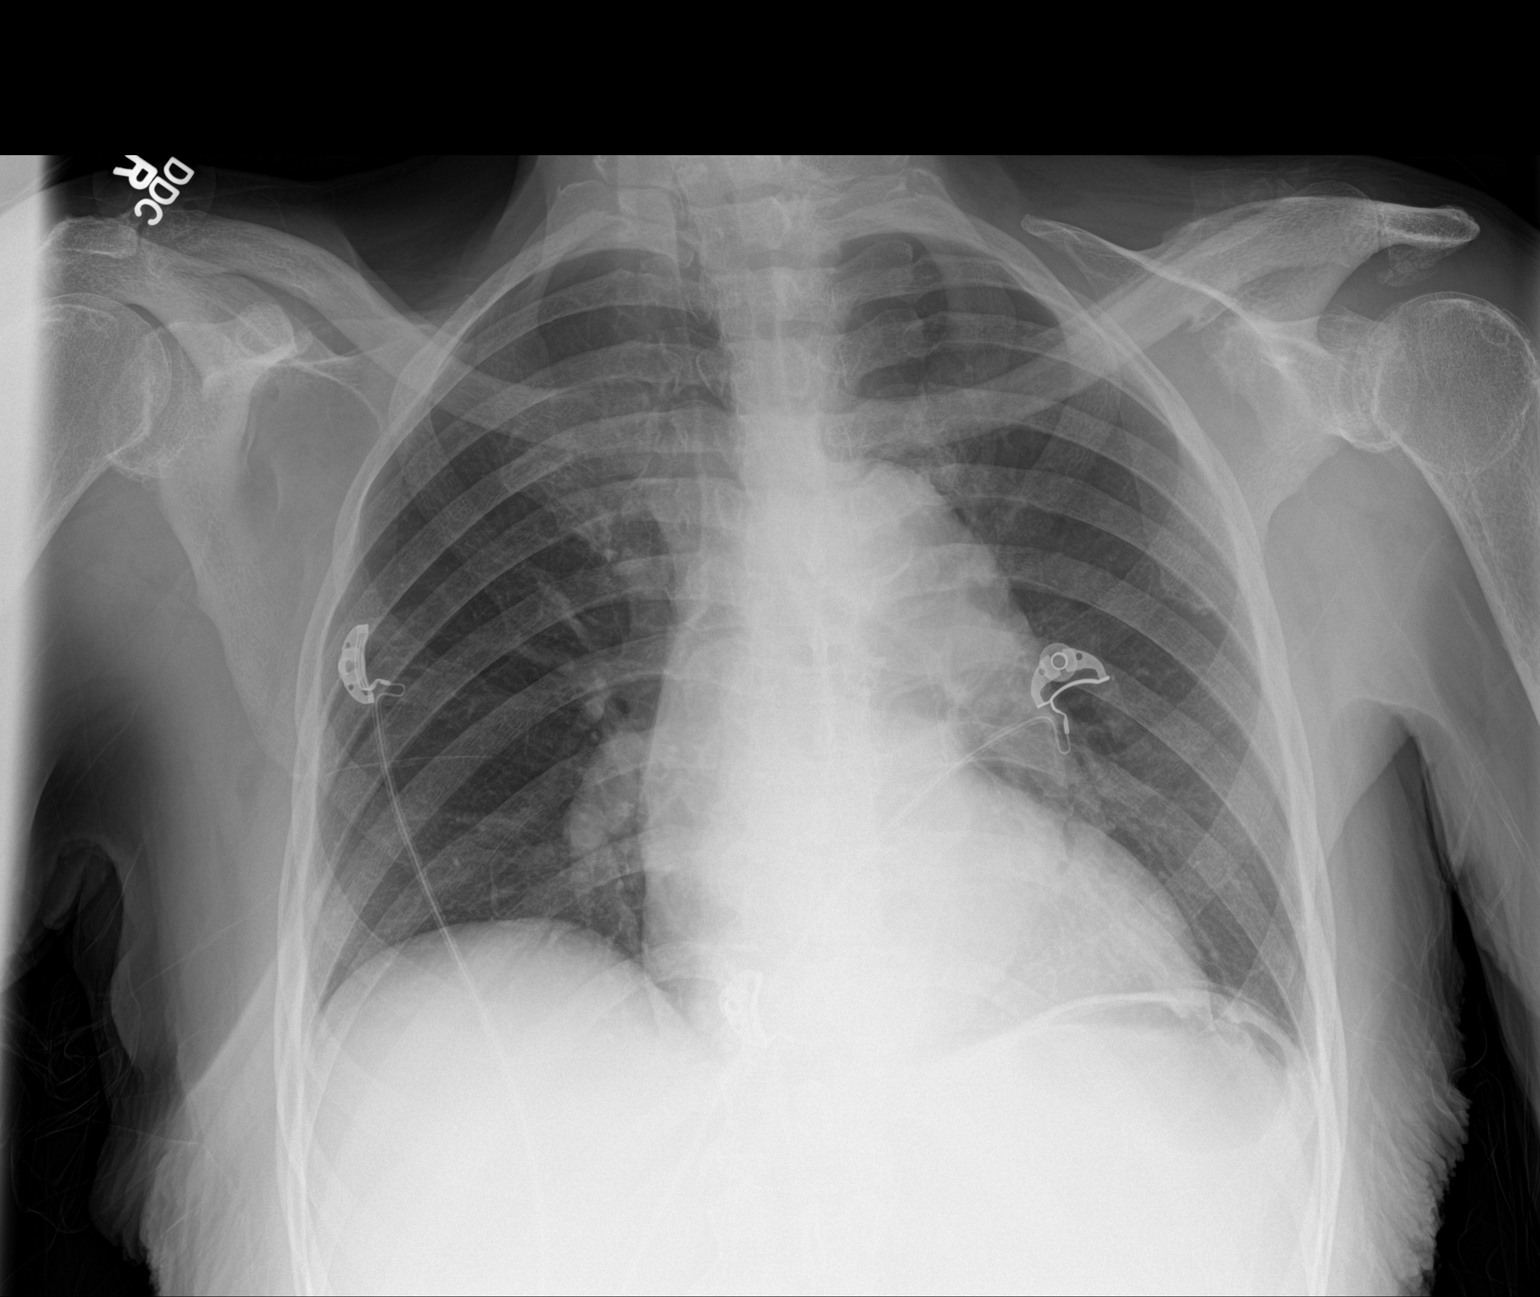

[2 of 2 positions shown; findings below may reference images not displayed]

FINDINGS: The heart size and mediastinal contours are within normal limits.
Both lungs are clear. The visualized skeletal structures are
unremarkable.
IMPRESSION: No active cardiopulmonary disease.

## 2015-01-19 IMAGING — CT CT CHEST W/ CM
2 of 3 series · 17 of 46 positions shown, 19 images · IV contrast (isovue)
Comparison: Abdomen CT on 11/22/13

CLINICAL DATA: Unexplained weight loss of approximately 70 lb over
past 3 months. Liver metastases seen on recent CT. Evaluate for
primary carcinoma.

EXAM:
CT CHEST WITH CONTRAST
TECHNIQUE: Multidetector CT imaging of the chest was performed during
intravenous contrast administration.
CONTRAST:  75 mL Isovue 300

[Series 2: routine chest with · axial · 0.62mm/px · z∈[-533,-298]mm · 14 of 55 slices shown, 16 images]
[im 4/55  soft-tissue]
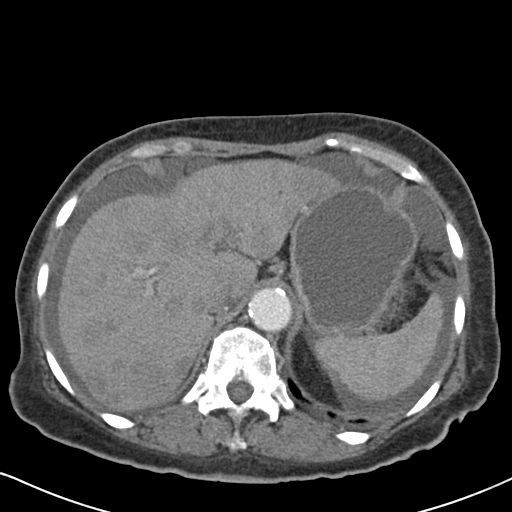
[im 4/55  bone]
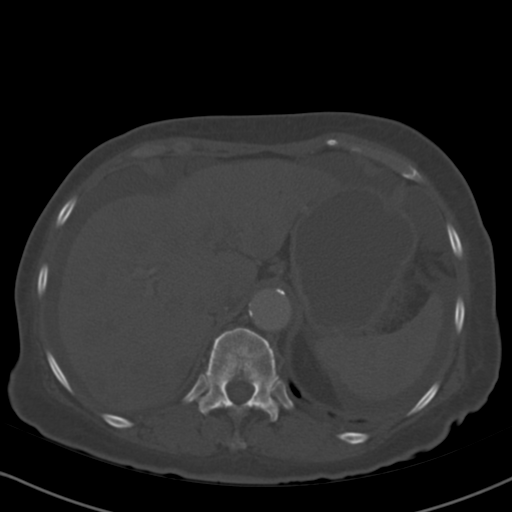
[im 7/55  soft-tissue]
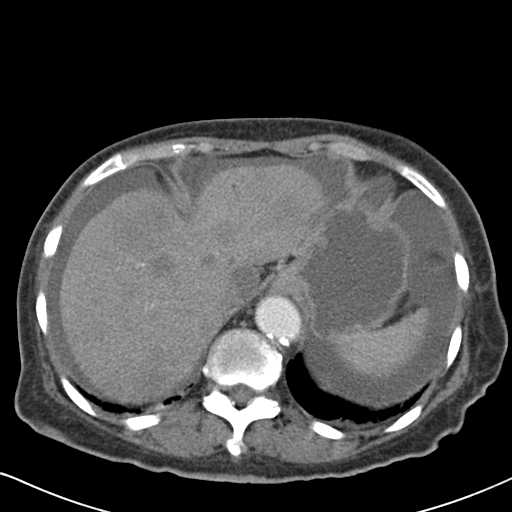
[im 11/55  soft-tissue]
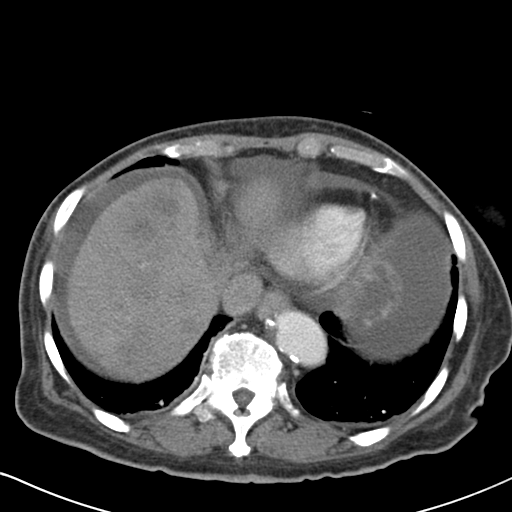
[im 14/55  soft-tissue]
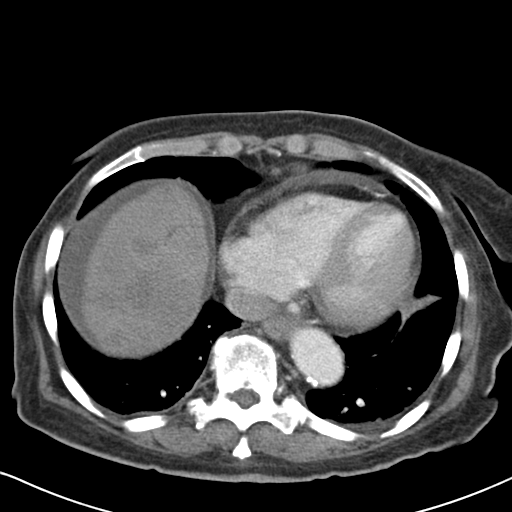
[im 18/55  soft-tissue]
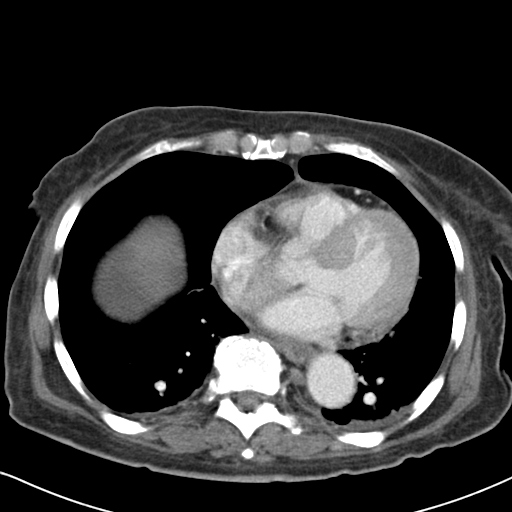
[im 21/55  soft-tissue]
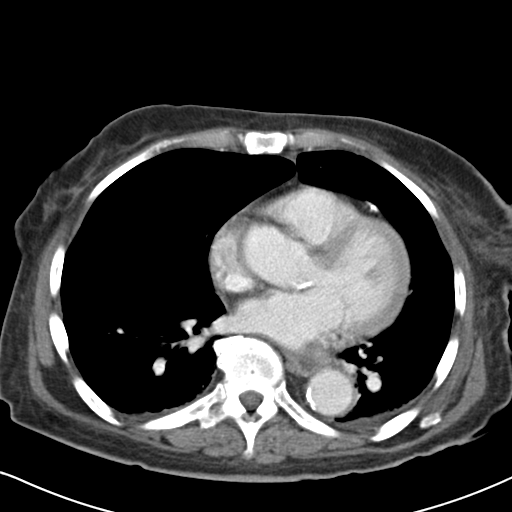
[im 25/55  soft-tissue]
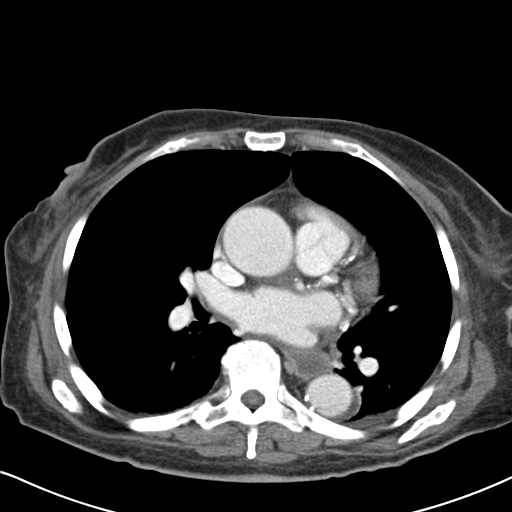
[im 30/55  soft-tissue]
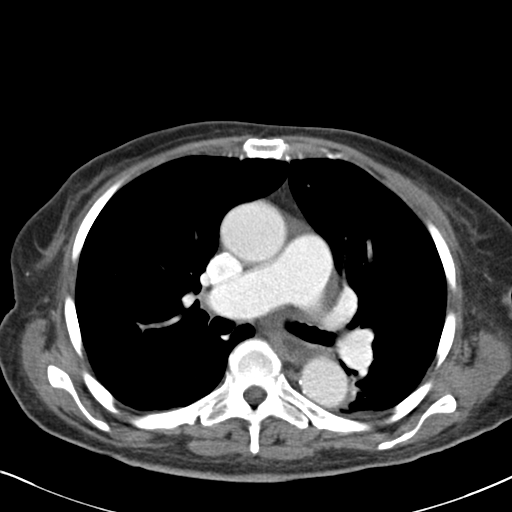
[im 34/55  soft-tissue]
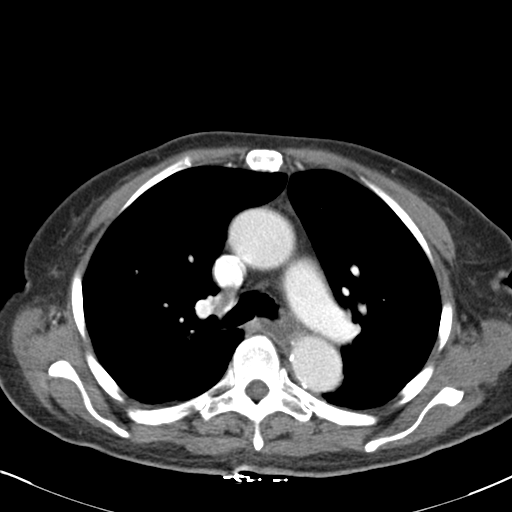
[im 34/55  bone]
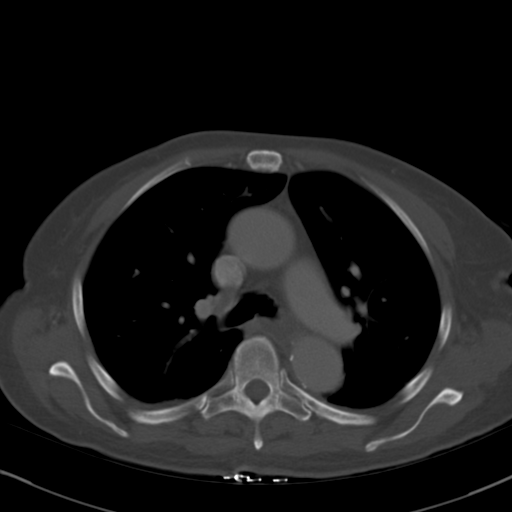
[im 37/55  soft-tissue]
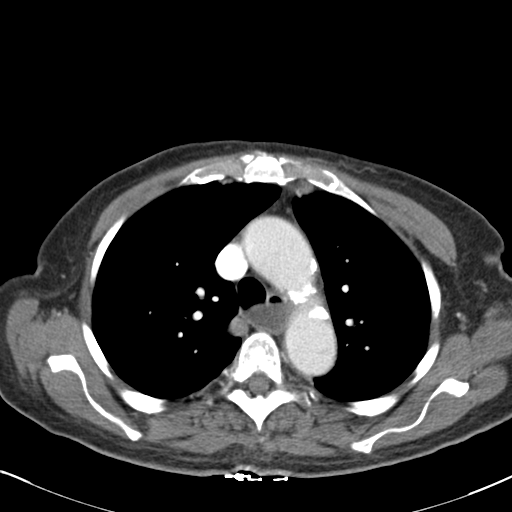
[im 41/55  soft-tissue]
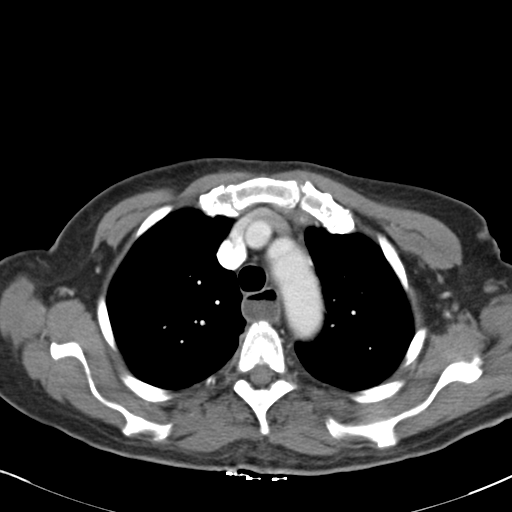
[im 44/55  soft-tissue]
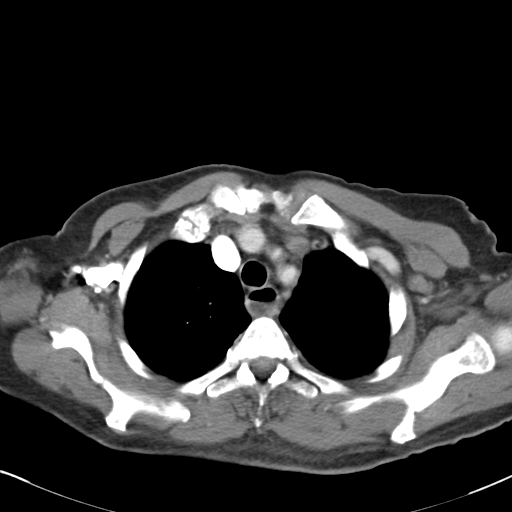
[im 48/55  soft-tissue]
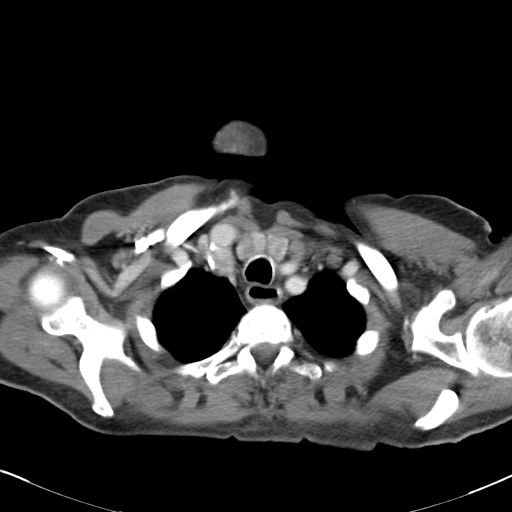
[im 51/55  soft-tissue]
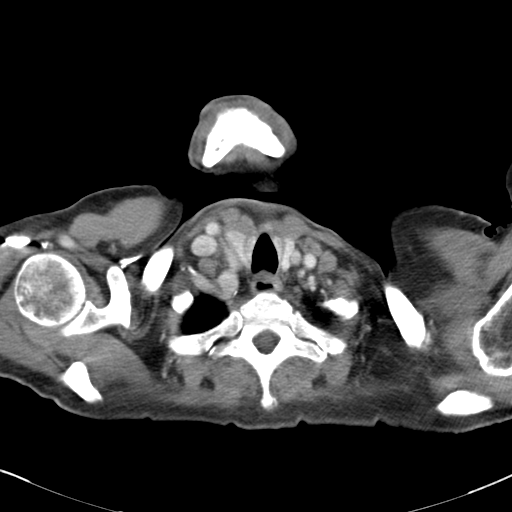

[Series 5: routine chest with cor · coronal · 0.54mm/px · 3 of 117 slices shown]
[im 39/117  soft-tissue]
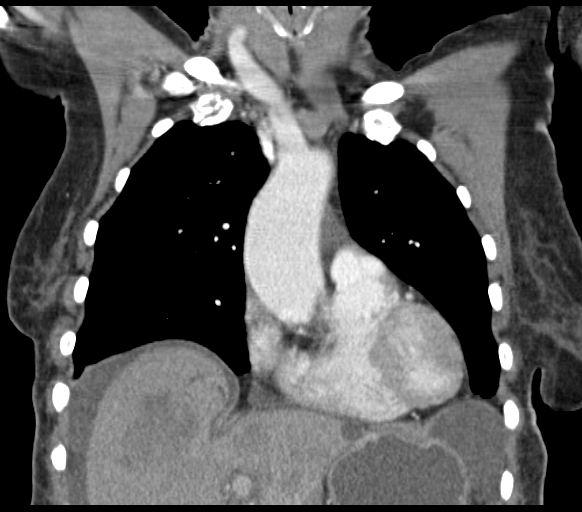
[im 52/117  soft-tissue]
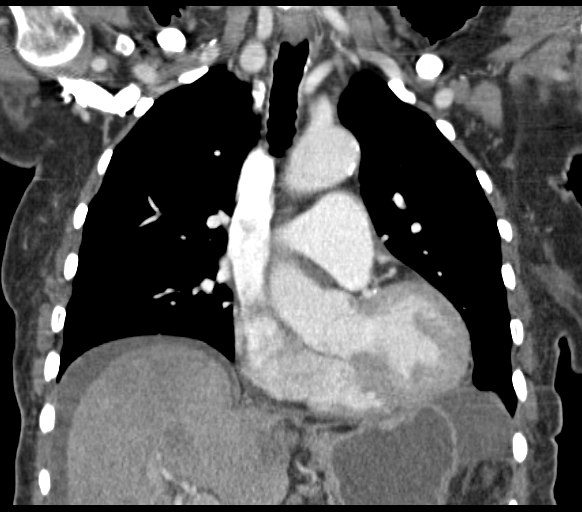
[im 65/117  soft-tissue]
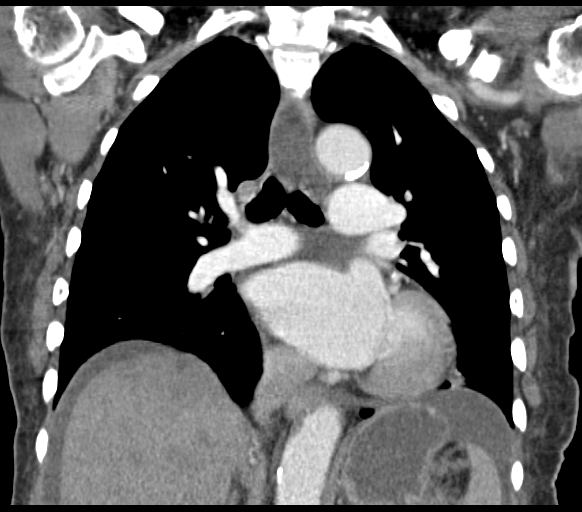

[17 of 46 positions shown; findings below may reference images not displayed]

FINDINGS: Mediastinum/Hilar Regions: No masses or pathologically enlarged
lymph nodes identified. Fluid-filled esophagus noted, without
definite evidence of esophageal mass.

Other Thoracic Lymphadenopathy:  None.

Lungs:  No pulmonary infiltrate or mass identified.

Pleura:  No evidence of effusion or mass.

Vascular/Cardiac: No thoracic aortic aneurysm or other significant
abnormality identified.

Musculoskeletal:  No suspicious bone lesions identified.

Other: Diffuse liver metastases again seen, as better demonstrated
on recent abdomen CT. Increased ascites noted in the right and left
upper quadrants.
IMPRESSION: No evidence of primary neoplasm or metastatic disease within the
thorax.

Diffuse liver metastases again noted. Increased upper abdominal
ascites since recent abdomen CT.
